# Patient Record
Sex: Female | Born: 1980 | Race: White | Hispanic: No | Smoking: Current every day smoker
Health system: Southern US, Community
[De-identification: ages and names within clinical notes are randomized; demographics above are authoritative.]

---
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
## 1999-01-08 ENCOUNTER — Inpatient Hospital Stay (HOSPITAL_COMMUNITY): Admission: AD | Admit: 1999-01-08 | Discharge: 1999-01-08 | Payer: Self-pay | Admitting: Obstetrics and Gynecology

## 1999-01-31 ENCOUNTER — Inpatient Hospital Stay (HOSPITAL_COMMUNITY): Admission: AD | Admit: 1999-01-31 | Discharge: 1999-02-03 | Payer: Self-pay | Admitting: Obstetrics and Gynecology

## 2000-04-11 ENCOUNTER — Other Ambulatory Visit: Admission: RE | Admit: 2000-04-11 | Discharge: 2000-04-11 | Payer: Self-pay | Admitting: Obstetrics and Gynecology

## 2006-12-06 ENCOUNTER — Inpatient Hospital Stay (HOSPITAL_COMMUNITY): Admission: AD | Admit: 2006-12-06 | Discharge: 2006-12-06 | Payer: Self-pay | Admitting: Obstetrics and Gynecology

## 2006-12-20 ENCOUNTER — Inpatient Hospital Stay (HOSPITAL_COMMUNITY): Admission: AD | Admit: 2006-12-20 | Discharge: 2006-12-20 | Payer: Self-pay | Admitting: Obstetrics and Gynecology

## 2007-01-09 ENCOUNTER — Ambulatory Visit (HOSPITAL_COMMUNITY): Admission: RE | Admit: 2007-01-09 | Discharge: 2007-01-09 | Payer: Self-pay | Admitting: Obstetrics and Gynecology

## 2007-01-15 ENCOUNTER — Inpatient Hospital Stay (HOSPITAL_COMMUNITY): Admission: AD | Admit: 2007-01-15 | Discharge: 2007-01-15 | Payer: Self-pay | Admitting: Obstetrics and Gynecology

## 2007-01-23 ENCOUNTER — Inpatient Hospital Stay (HOSPITAL_COMMUNITY): Admission: AD | Admit: 2007-01-23 | Discharge: 2007-01-24 | Payer: Self-pay | Admitting: Obstetrics and Gynecology

## 2007-01-23 ENCOUNTER — Ambulatory Visit (HOSPITAL_COMMUNITY): Admission: AD | Admit: 2007-01-23 | Discharge: 2007-01-23 | Payer: Self-pay | Admitting: Obstetrics and Gynecology

## 2007-01-24 ENCOUNTER — Ambulatory Visit (HOSPITAL_COMMUNITY): Admission: RE | Admit: 2007-01-24 | Discharge: 2007-01-24 | Payer: Self-pay | Admitting: Obstetrics and Gynecology

## 2007-01-27 ENCOUNTER — Inpatient Hospital Stay (HOSPITAL_COMMUNITY): Admission: AD | Admit: 2007-01-27 | Discharge: 2007-01-27 | Payer: Self-pay | Admitting: Obstetrics and Gynecology

## 2007-01-29 ENCOUNTER — Inpatient Hospital Stay (HOSPITAL_COMMUNITY): Admission: AD | Admit: 2007-01-29 | Discharge: 2007-01-29 | Payer: Self-pay | Admitting: Obstetrics and Gynecology

## 2007-02-01 ENCOUNTER — Inpatient Hospital Stay (HOSPITAL_COMMUNITY): Admission: AD | Admit: 2007-02-01 | Discharge: 2007-02-01 | Payer: Self-pay | Admitting: Obstetrics and Gynecology

## 2007-02-05 ENCOUNTER — Inpatient Hospital Stay (HOSPITAL_COMMUNITY): Admission: AD | Admit: 2007-02-05 | Discharge: 2007-02-05 | Payer: Self-pay | Admitting: Obstetrics and Gynecology

## 2007-02-06 ENCOUNTER — Inpatient Hospital Stay (HOSPITAL_COMMUNITY): Admission: AD | Admit: 2007-02-06 | Discharge: 2007-02-06 | Payer: Self-pay | Admitting: Obstetrics and Gynecology

## 2007-02-08 ENCOUNTER — Encounter (INDEPENDENT_AMBULATORY_CARE_PROVIDER_SITE_OTHER): Payer: Self-pay | Admitting: Obstetrics and Gynecology

## 2007-02-08 ENCOUNTER — Inpatient Hospital Stay (HOSPITAL_COMMUNITY): Admission: AD | Admit: 2007-02-08 | Discharge: 2007-02-10 | Payer: Self-pay | Admitting: Obstetrics and Gynecology

## 2007-11-16 ENCOUNTER — Ambulatory Visit (HOSPITAL_COMMUNITY): Admission: RE | Admit: 2007-11-16 | Discharge: 2007-11-16 | Payer: Self-pay | Admitting: Obstetrics and Gynecology

## 2009-04-06 IMAGING — US US FETAL BPP W/O NONSTRESS
1 series · 14 of 18 positions shown · non-contrast
Comparison: none

OBSTETRICAL ULTRASOUND:

 This ultrasound exam was performed in the [HOSPITAL] Ultrasound Department.  The OB US report was generated in the AS system, and faxed to the ordering physician.  This report is also available in [REDACTED] PACS.

[Series 1: us fetal bpp w/o nonstress · non-contrast · 14 of 18 slices shown]
[im 1/18]
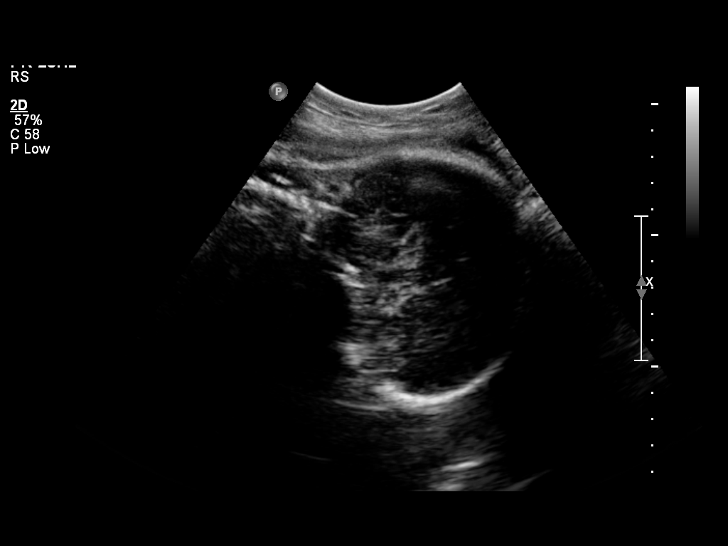
[im 2/18]
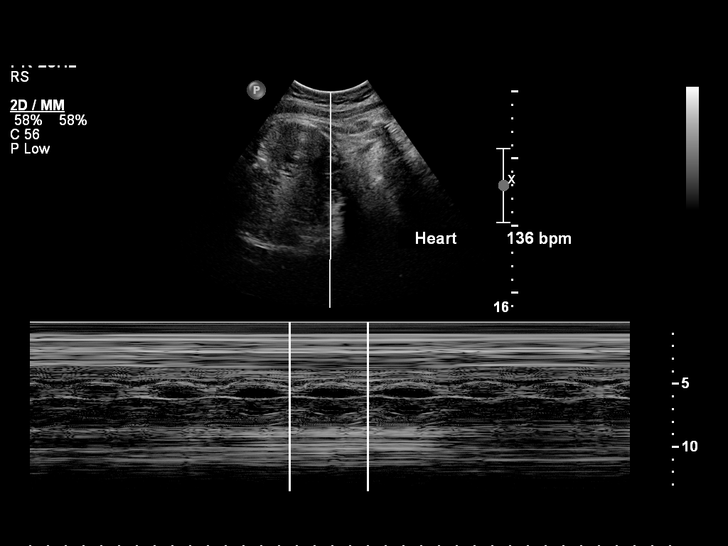
[im 4/18]
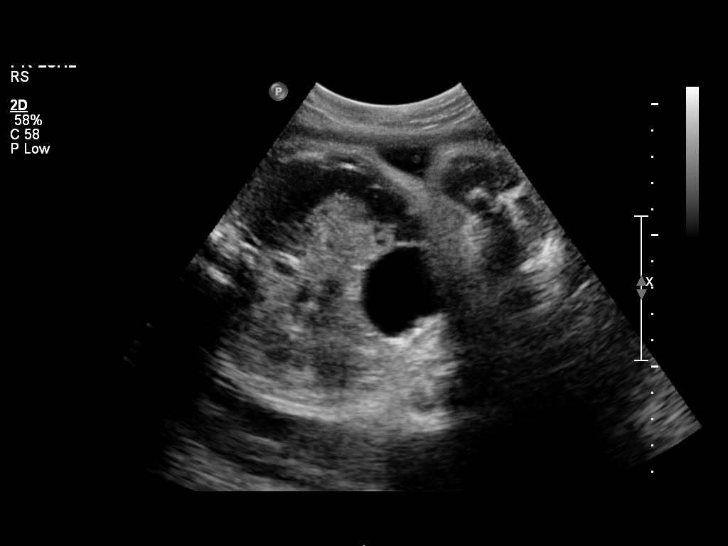
[im 5/18]
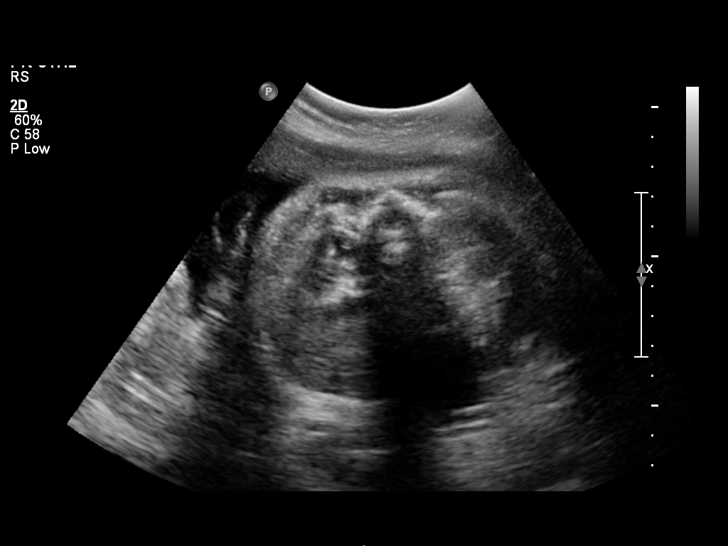
[im 6/18]
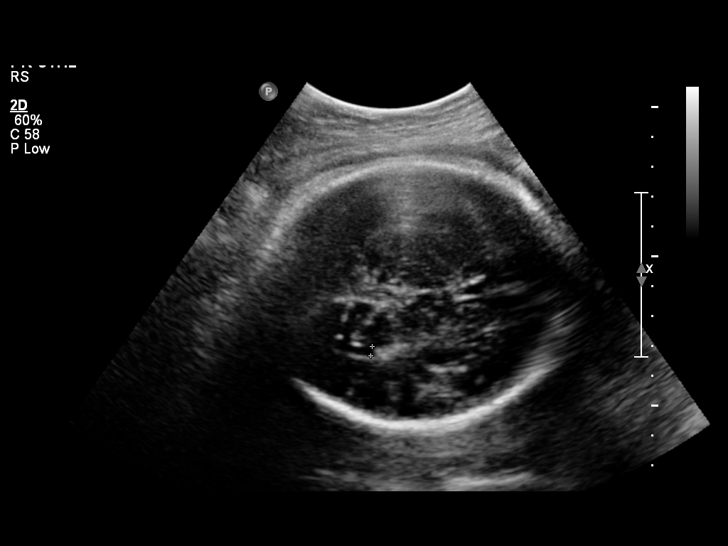
[im 8/18]
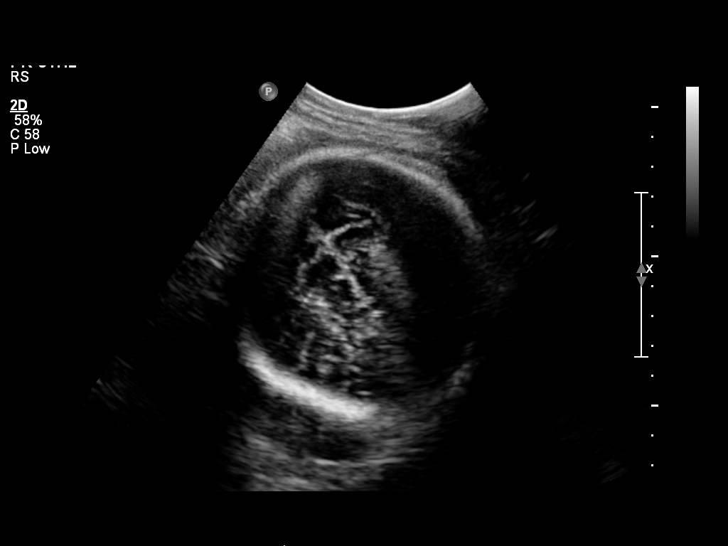
[im 9/18]
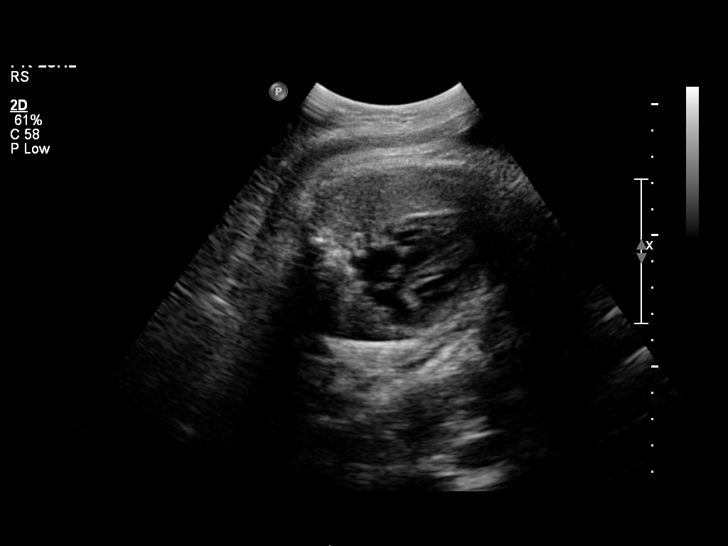
[im 10/18]
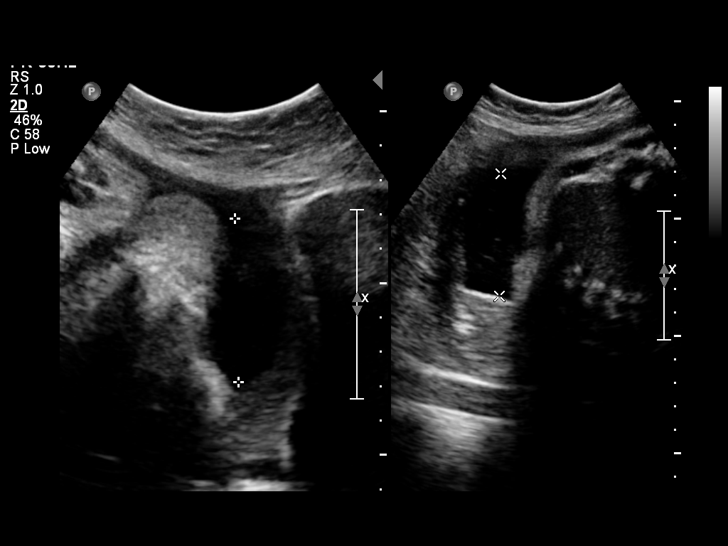
[im 11/18]
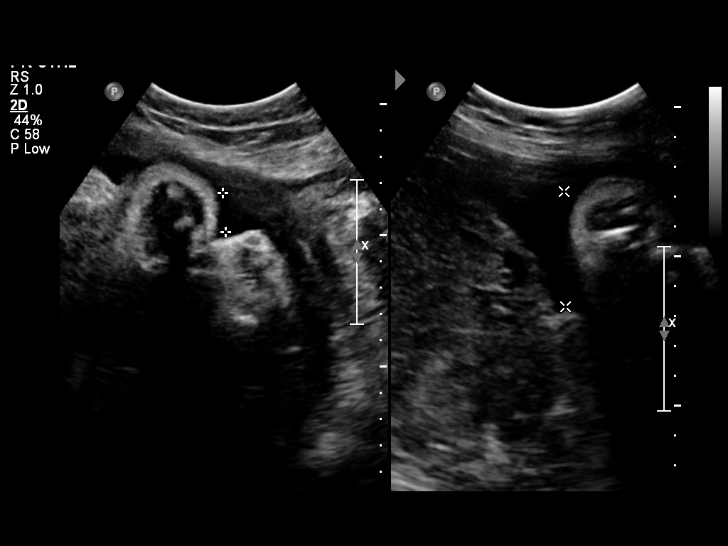
[im 13/18]
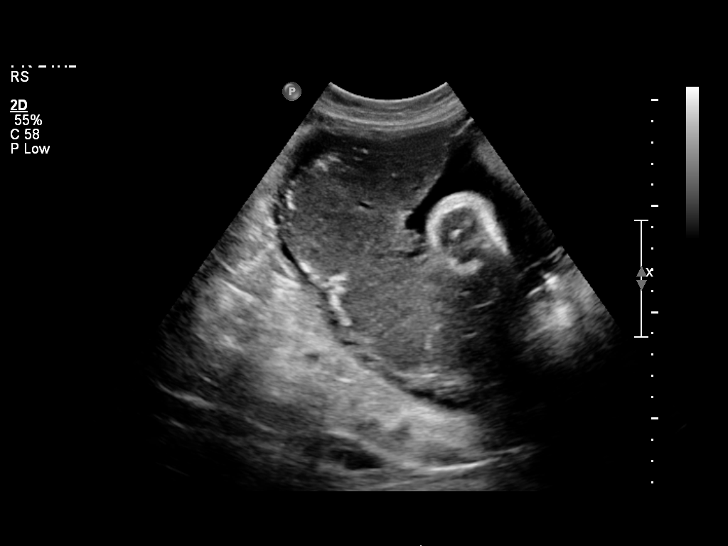
[im 14/18]
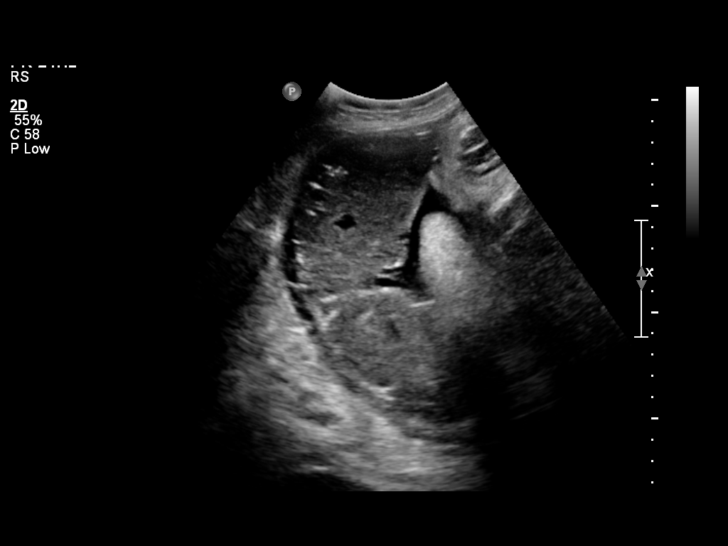
[im 15/18]
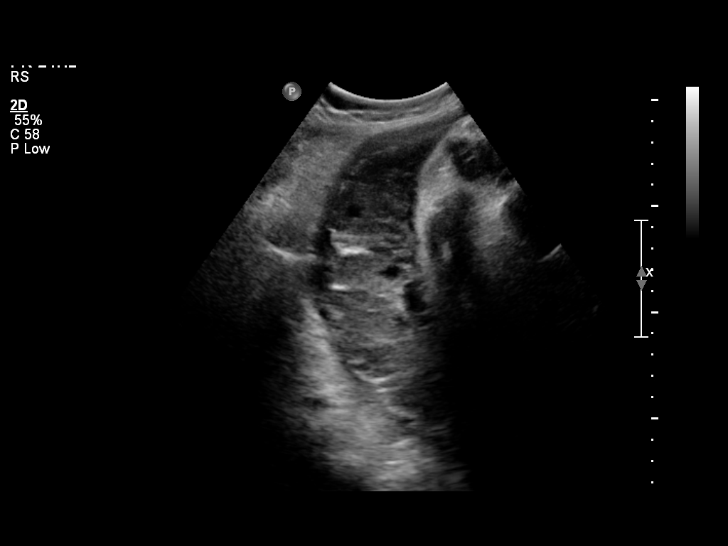
[im 17/18]
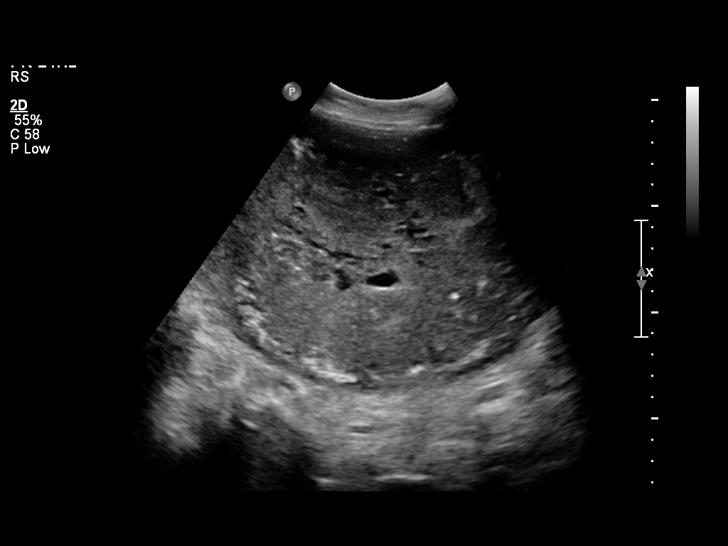
[im 18/18]
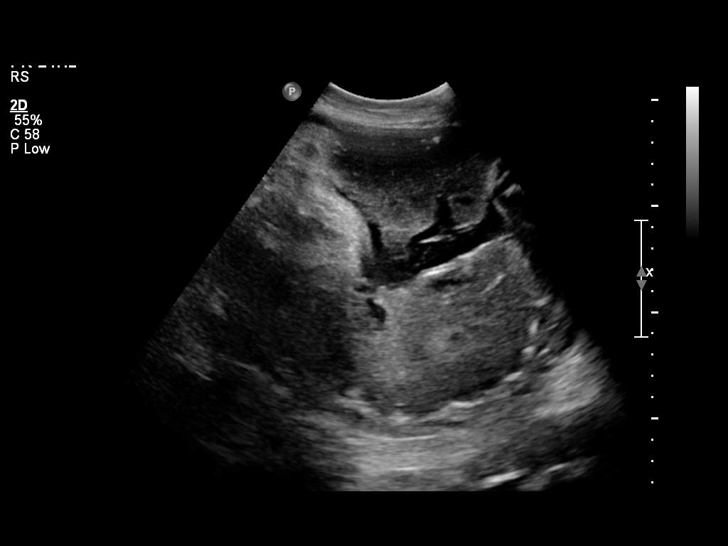

[14 of 18 positions shown; findings below may reference images not displayed]

IMPRESSION: See [facility] Obstetric US report.

## 2010-09-26 ENCOUNTER — Encounter: Payer: Self-pay | Admitting: Obstetrics and Gynecology

## 2011-01-18 NOTE — Discharge Summary (Signed)
NAMEMAYZIE, CAUGHLIN NO.:  [id number]   MEDICAL RECORD NO.:  [id number]          PATIENT TYPE:  INP   LOCATION:  9104                          FACILITY:  [facility]   PHYSICIAN:  Sherron Monday, MD        DATE OF BIRTH:  1980/10/01   DATE OF ADMISSION:  02/08/2007  DATE OF DISCHARGE:  02/10/2007                               DISCHARGE SUMMARY   ADMISSION DIAGNOSES:  1. Intrauterine pregnancy at term, who presented for induction with      complications of nausea and vomiting and decreased potassium.   DISCHARGE DIAGNOSES:  1. Intrauterine pregnancy  at term, who presented for induction with      complications of nausea and vomiting and decreased potassium.  2. Delivered via spontaneous vaginal delivery.   HISTORY OF PRESENT ILLNESS:  A 30 year old G3, P2 at 37 weeks by a 9 to  10 week ultrasound with an EDC of March 01, 2007, sent for induction  secondary to multiple episodes of nausea and vomiting despite daily  Phenergan and Zofran. She also has decreased potassium despite  supplementation. She has also had complications of complaints of rupture  of membrane without ability to document this, and normal AFI by  ultrasound. She has had decreased fetal movement with reassuring NST's  but has had to have multiple BPP's for evaluation. Occasional elevated  blood pressure in the office, however, her PIH labs were normal. She had  some problems with headache in early pregnancy. She also had some  problems with eczema and has had Lexapro for depression.   PAST MEDICAL HISTORY:  Depression and hypoglycemia.   PAST SURGICAL HISTORY:  Cholecystectomy.   PAST OB-GYN HISTORY:  G3, P2-0-0-2 with G 1 and 2 being term vaginal  deliveries. She has a history of dysplasia and cryotherapy.   MEDICATIONS:  Include Lexapro, Phenergan, and Zofran.   ALLERGIES:  Questionable allergy to PENICILLIN, which causes a fever.   SOCIAL HISTORY:  The patient denies alcohol or drug use during  her  pregnancy. She is a smoker.   PRENATAL LABORATORY DATA:  She is A negative,antibody screen negative,  RPR non-reactive, Rubella immune. Hepatitis B surface antigen is  negative. Gonorrhea and Chlamydia are negative. Glucola 122, group B  strep negative.   HOSPITAL COURSE:  She is admitted with a vaginal examination 2 to 3 cm  dilated, 70% effaced, and minus 2 station. She is ruptured for clear  fluid. Prior to induction, it was discussed with her the chance of fetal  lungs being immature and needing NICU care. The patient voiced  understanding  and wished to be induced.  She progressed to complete  complete,  to push well to deliver a viable female infant with Apgar's  of 8 at 1 minute and 9 at 5 minutes and weight of 6 pounds, 0 ounces.  GBL was less than 500. Her postpartum course was relatively  uncomplicated. Hemoglobin decreased from 9.9 to 8.1. Her potassium was  stable, however, decreased. She will continue her K-Dur supplementation.  The infant was having some problems with reflux  and is being evaluated  in the NICU. The patient with the understanding that the baby might not  be discharged at the same time that she is. She is discharged on  postpartum day 2, doing well without complaints. She is given routine  discharge instructions and numbers to call with any questions or  problems.  Prescriptions for Motrin and Vicodin and to continue her K-  Dur.   FOLLOWUP:  She will followup in approximately 6 weeks.      Sherron Monday, MD  Electronically Signed     JB/MEDQ  D:  02/10/2007  T:  02/10/2007  Job:  [id number]

## 2011-06-23 LAB — CBC
HCT: 24.3 — ABNORMAL LOW
HCT: 29 — ABNORMAL LOW
Hemoglobin: 8.1 — ABNORMAL LOW
Hemoglobin: 9.9 — ABNORMAL LOW
MCHC: 33.4
MCV: 89.1
MCV: 90.6
Platelets: 236
RBC: 3.14 — ABNORMAL LOW
RDW: 14.8 — ABNORMAL HIGH
WBC: 10.9 — ABNORMAL HIGH
WBC: 13.3 — ABNORMAL HIGH

## 2011-06-23 LAB — BASIC METABOLIC PANEL
CO2: 24
Chloride: 103
Chloride: 104
Creatinine, Ser: 0.5
GFR calc Af Amer: >60
Glucose, Bld: 100 — ABNORMAL HIGH
Potassium: 3.1 — ABNORMAL LOW
Potassium: 3.2 — ABNORMAL LOW
Sodium: 135

## 2011-06-23 LAB — URINALYSIS, ROUTINE W REFLEX MICROSCOPIC
Glucose, UA: NEGATIVE
Glucose, UA: NEGATIVE
Hgb urine dipstick: NEGATIVE
Hgb urine dipstick: NEGATIVE
Protein, ur: NEGATIVE
Specific Gravity, Urine: <1.005 — ABNORMAL LOW
pH: 6
pH: 7.5

## 2011-06-23 LAB — COMPREHENSIVE METABOLIC PANEL
ALT: 10
AST: 20
Albumin: 2 — ABNORMAL LOW
Calcium: 8.4
Chloride: 105
Creatinine, Ser: 0.56
GFR calc Af Amer: >60
Sodium: 140
Total Bilirubin: 0.3

## 2011-06-23 LAB — RH IMMUNE GLOB WKUP(>/=20WKS)(NOT WOMEN'S HOSP)

## 2011-06-23 LAB — URINE MICROSCOPIC-ADD ON

## 2017-05-29 ENCOUNTER — Emergency Department (HOSPITAL_COMMUNITY)
Admission: EM | Admit: 2017-05-29 | Discharge: 2017-05-29 | Disposition: A | Discharge status: Home / Self Care | Payer: Medicaid Other | Attending: Emergency Medicine | Admitting: Emergency Medicine

## 2017-05-29 ENCOUNTER — Encounter (HOSPITAL_COMMUNITY): Payer: Self-pay | Admitting: Emergency Medicine

## 2017-05-29 DIAGNOSIS — F17210 Nicotine dependence, cigarettes, uncomplicated: Secondary | ICD-10-CM | POA: Diagnosis not present

## 2017-05-29 DIAGNOSIS — R110 Nausea: Secondary | ICD-10-CM

## 2017-05-29 DIAGNOSIS — K0889 Other specified disorders of teeth and supporting structures: Secondary | ICD-10-CM | POA: Diagnosis present

## 2017-05-29 DIAGNOSIS — K047 Periapical abscess without sinus: Secondary | ICD-10-CM

## 2017-05-29 DIAGNOSIS — Z720 Tobacco use: Secondary | ICD-10-CM

## 2017-05-29 DIAGNOSIS — K029 Dental caries, unspecified: Secondary | ICD-10-CM | POA: Diagnosis not present

## 2017-05-29 LAB — URINALYSIS, ROUTINE W REFLEX MICROSCOPIC
Bilirubin Urine: NEGATIVE
Glucose, UA: NEGATIVE mg/dL
Hgb urine dipstick: NEGATIVE
Ketones, ur: NEGATIVE mg/dL
Leukocytes, UA: NEGATIVE
Nitrite: NEGATIVE
Protein, ur: NEGATIVE mg/dL
Specific Gravity, Urine: 1.002 — ABNORMAL LOW (ref 1.005–1.030)
pH: 6 (ref 5.0–8.0)

## 2017-05-29 LAB — POC URINE PREG, ED: Preg Test, Ur: NEGATIVE

## 2017-05-29 MED ORDER — ONDANSETRON 4 MG PO TBDP
8.0000 mg | ORAL_TABLET | Freq: Once | ORAL | Status: AC
  Administered 2017-05-29: 8 mg via ORAL
  Filled 2017-05-29: qty 2

## 2017-05-29 MED ORDER — ONDANSETRON HCL 8 MG PO TABS: 8.0000 mg | ORAL_TABLET | Freq: Three times a day (TID) | ORAL | 0 refills | Status: AC | PRN

## 2017-05-29 MED ORDER — DOXYCYCLINE HYCLATE 100 MG PO CAPS: 100.0000 mg | ORAL_CAPSULE | Freq: Two times a day (BID) | ORAL | 0 refills | Status: AC

## 2017-05-29 NOTE — ED Notes (Signed)
Patient given discharge instructions and verbalized understanding.  Patient stable to discharge at this time.  Patient is alert and oriented to baseline.  No distressed noted at this time.  All belongings taken with the patient at discharge.   

## 2017-05-29 NOTE — Discharge Instructions (Signed)
Apply warm and ice compresses to jaw throughout the day. Take antibiotic until finished. Alternate between tylenol and motrin as needed for pain. Perform warm salt water swishes to help with pain/swelling. Use over the counter oragel as needed for additional relief. STOP SMOKING! Use zofran as directed as needed for nausea. Followup with a dentist is very important for ongoing evaluation and management of recurrent dental pain, call the dentist listed above in the next 24-48 hours to schedule ongoing dental care, or use the list below to find a dentist in the next 24-48 hours for ongoing management of your dental issue. Return to emergency department for emergent changing or worsening symptoms.

## 2017-05-29 NOTE — ED Provider Notes (Signed)
[facility]-EMERGENCY DEPT Provider Note   CSN: [id number] Arrival date & time: 05/29/17  4098     History   Chief Complaint Chief Complaint  Patient presents with  . Dental Problem  . Hematuria    HPI Kara Ross is a 36 y.o. female who presents to the ED with complaints of left upper dental pain/swelling 1 day. Patient has had a broken tooth for a while, but on Saturday she believes that a bigger portion broke off because yesterday her pain began. She describes the pain is 9/10 constant aching nonradiating left upper dental pain which worsens with chewing, and has been mildly improved with ice, salt water swishes, and Advil use. She reports associated left face swelling and gum swelling as well as nausea. She denies any gum drainage, drooling, trismus, ear pain or drainage, fevers, chills, CP, SOB, abd pain, N/V/D/C, hematuria, dysuria, myalgias, arthralgias, numbness, tingling, focal weakness, or any other complaints at this time. +Smoker. No dental care at this time, or in "a while".  Of note, she denies hematuria, however nursing notes stated she had hematuria complaint. Pt clarifies this, stating that she had hematuria on Thursday last week, went to [facility] and was given Macrobid for ?UTI; however UCx was sent and today she received a call stating she could stop the macrobid because the UCx was negative. Pt states the hematuria only last a few days and stopped several days ago, and mentions that it's not ongoing at this time. She was advised to f/up with her PCP for follow up of the hematuria she had last week, and planned to do so. She is not concerned with this today, nor was this the reason for her ED visit.    The history is provided by the patient and medical records. No language interpreter was used.  Dental Pain   This is a new problem. The current episode started yesterday. The problem occurs constantly. The problem has not changed since onset.The pain is at a  severity of 9/10. The pain is moderate. She has tried ice (and advil and salt water swishes) for the symptoms. The treatment provided mild relief.    History reviewed. No pertinent past medical history.  There are no active problems to display for this patient.   History reviewed. No pertinent surgical history.  OB History    No data available       Home Medications    Prior to Admission medications   Not on File    Family History No family history on file.  Social History Social History  Substance Use Topics  . Smoking status: Current Every Day Smoker    Packs/day: 0.50    Types: Cigarettes  . Smokeless tobacco: Never Used  . Alcohol use No     Allergies   Patient has no allergy information on record.   Review of Systems Review of Systems  Constitutional: Negative for chills and fever.  HENT: Positive for dental problem and facial swelling. Negative for drooling, ear discharge, ear pain and trouble swallowing.   Respiratory: Negative for shortness of breath.   Cardiovascular: Negative for chest pain.  Gastrointestinal: Negative for abdominal pain, constipation, diarrhea, nausea and vomiting.  Genitourinary: Negative for dysuria and hematuria.  Musculoskeletal: Negative for arthralgias and myalgias.  Skin: Negative for color change.  Allergic/Immunologic: Negative for immunocompromised state.  Neurological: Negative for weakness and numbness.  Psychiatric/Behavioral: Negative for confusion.   All other systems reviewed and are negative for acute  change except as noted in the HPI.    Physical Exam Updated Vital Signs BP 129/86 (BP Location: Left Arm)   Pulse 93   Temp 98.3 F (36.8 C) (Oral)   Resp 18   LMP 05/08/2017   SpO2 100%   Physical Exam  Constitutional: She is oriented to person, place, and time. Vital signs are normal. She appears well-developed and well-nourished.  Non-toxic appearance. No distress.  Afebrile, nontoxic, NAD  HENT:  Head:  Normocephalic and atraumatic.  Nose: Nose normal.  Mouth/Throat: Uvula is midline, oropharynx is clear and moist and mucous membranes are normal. No trismus in the jaw. Dental abscesses and dental caries present. No uvula swelling. Tonsils are 0 on the right. Tonsils are 0 on the left. No tonsillar exudate.    Diffuse dental decay. L upper molar #15 with caries and small fragment of posterior molar missing, moderate TTP, mild surrounding gingival erythema and slight swelling but no focal dental abscess or pocket of fluctuance noted, mild L upper cheek swelling, no evidence of ludwig's. Nose clear. Oropharynx clear and moist, without uvular swelling or deviation, no trismus or drooling, no tonsillar swelling or erythema, no exudates.    Eyes: Conjunctivae and EOM are normal. Right eye exhibits no discharge. Left eye exhibits no discharge.  Neck: Normal range of motion. Neck supple.  Cardiovascular: Normal rate and intact distal pulses.   Pulmonary/Chest: Effort normal. No respiratory distress.  Abdominal: Normal appearance. She exhibits no distension.  Musculoskeletal: Normal range of motion.  Neurological: She is alert and oriented to person, place, and time. She has normal strength. No sensory deficit.  Skin: Skin is warm, dry and intact. No rash noted.  Psychiatric: She has a normal mood and affect. Her behavior is normal.  Nursing note and vitals reviewed.    ED Treatments / Results  Labs (all labs ordered are listed, but only abnormal results are displayed) Labs Reviewed  URINALYSIS, ROUTINE W REFLEX MICROSCOPIC - Abnormal; Notable for the following:       Result Value   Color, Urine STRAW (*)    Specific Gravity, Urine 1.002 (*)    All other components within normal limits  POC URINE PREG, ED    EKG  EKG Interpretation None       Radiology No results found.  Procedures Procedures (including critical care time)  Medications Ordered in ED Medications  ondansetron  (ZOFRAN-ODT) disintegrating tablet 8 mg (8 mg Oral Given 05/29/17 1309)     Initial Impression / Assessment and Plan / ED Course  I have reviewed the triage vital signs and the nursing notes.  Pertinent labs & imaging results that were available during my care of the patient were reviewed by me and considered in my medical decision making (see chart for details).     36 y.o. female here with c/o dental pain associated with dental caries and early dental abscess, no pocket of abscess to gumline that would be amendable to I&D today; patient afebrile, non toxic appearing and swallowing secretions well, no evidence of ludwig's. I gave patient referral to dentist and stressed the importance of dental follow up for ultimate management of dental pain.  I have also discussed reasons to return immediately to the ER.  Patient expresses understanding and agrees with plan.  I will also give doxycycline and discussed OTC remedies for pain control. Smoking cessation advised.   Of note, pt denies any hematuria to me, nursing notes from triage reported that she had hematuria; pt  states it was only occurring last week for a few days, she was seen at [facility] and started on macrobid although today they called her and told her to stop taking it because her UCx was negative, so she needed to f/up with PCP for hematuria f/up. Pt states hematuria had stopped a few days ago. Denies any urinary complaints. Upreg today neg, U/A completely unremarkable and without hematuria. Doubt need for further emergent work up of this at this time, advised f/up with PCP for any further follow up on the hematuria she had last week.   I explained the diagnosis and have given explicit precautions to return to the ER including for any other new or worsening symptoms. The patient understands and accepts the medical plan as it's been dictated and I have answered their questions. Discharge instructions concerning home care and prescriptions have been  given. The patient is STABLE and is discharged to home in good condition.    Final Clinical Impressions(s) / ED Diagnoses   Final diagnoses:  Pain due to dental caries  Dental abscess  Tobacco user  Nausea    New Prescriptions New Prescriptions   DOXYCYCLINE (VIBRAMYCIN) 100 MG CAPSULE    Take 1 capsule (100 mg total) by mouth 2 (two) times daily. One po bid x 7 days   ONDANSETRON (ZOFRAN) 8 MG TABLET    Take 1 tablet (8 mg total) by mouth every 8 (eight) hours as needed for nausea or vomiting.     [address] [place], [place] 05/29/17 1324    Raeford Razor, MD 05/30/17 1407

## 2017-05-29 NOTE — ED Triage Notes (Signed)
Pt to ER for evaluation of left upper dental pain and swelling onset yesterday. Also states hematuria x1 week, has been on macrobid since Thursday where she was seen for same, has not had any relief.
